# Patient Record
Sex: Male | Born: 1990 | Race: Black or African American | Hispanic: No | Marital: Single | Smoking: Current every day smoker
Health system: Southern US, Community
[De-identification: ages and names within clinical notes are randomized; demographics above are authoritative.]

---
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
## 2014-05-29 ENCOUNTER — Emergency Department (HOSPITAL_COMMUNITY)
Admission: EM | Admit: 2014-05-29 | Discharge: 2014-05-29 | Disposition: A | Discharge status: Home / Self Care | Payer: Self-pay | Attending: Emergency Medicine | Admitting: Emergency Medicine

## 2014-05-29 ENCOUNTER — Encounter (HOSPITAL_COMMUNITY): Payer: Self-pay | Admitting: Emergency Medicine

## 2014-05-29 ENCOUNTER — Emergency Department (HOSPITAL_COMMUNITY): Payer: Self-pay

## 2014-05-29 DIAGNOSIS — T40901A Poisoning by unspecified psychodysleptics [hallucinogens], accidental (unintentional), initial encounter: Secondary | ICD-10-CM | POA: Insufficient documentation

## 2014-05-29 DIAGNOSIS — F411 Generalized anxiety disorder: Secondary | ICD-10-CM | POA: Insufficient documentation

## 2014-05-29 DIAGNOSIS — T50901A Poisoning by unspecified drugs, medicaments and biological substances, accidental (unintentional), initial encounter: Secondary | ICD-10-CM

## 2014-05-29 DIAGNOSIS — T40904A Poisoning by unspecified psychodysleptics [hallucinogens], undetermined, initial encounter: Secondary | ICD-10-CM | POA: Insufficient documentation

## 2014-05-29 DIAGNOSIS — R000 Tachycardia, unspecified: Secondary | ICD-10-CM | POA: Insufficient documentation

## 2014-05-29 DIAGNOSIS — F17200 Nicotine dependence, unspecified, uncomplicated: Secondary | ICD-10-CM | POA: Insufficient documentation

## 2014-05-29 DIAGNOSIS — Y9289 Other specified places as the place of occurrence of the external cause: Secondary | ICD-10-CM | POA: Insufficient documentation

## 2014-05-29 DIAGNOSIS — R002 Palpitations: Secondary | ICD-10-CM | POA: Insufficient documentation

## 2014-05-29 DIAGNOSIS — Y9389 Activity, other specified: Secondary | ICD-10-CM | POA: Insufficient documentation

## 2014-05-29 LAB — RAPID URINE DRUG SCREEN, HOSP PERFORMED
AMPHETAMINES: NOT DETECTED
BENZODIAZEPINES: NOT DETECTED
Barbiturates: NOT DETECTED
Cocaine: NOT DETECTED
Opiates: NOT DETECTED
Tetrahydrocannabinol: POSITIVE — AB

## 2014-05-29 LAB — CBC WITH DIFFERENTIAL/PLATELET
BASOS ABS: 0 10*3/uL (ref 0.0–0.1)
Basophils Relative: 0 % (ref 0–1)
EOS PCT: 2 % (ref 0–5)
Eosinophils Absolute: 0.1 10*3/uL (ref 0.0–0.7)
HCT: 40 % (ref 39.0–52.0)
Hemoglobin: 13.9 g/dL (ref 13.0–17.0)
LYMPHS ABS: 1.1 10*3/uL (ref 0.7–4.0)
Lymphocytes Relative: 22 % (ref 12–46)
MCH: 26.9 pg (ref 26.0–34.0)
MCHC: 34.8 g/dL (ref 30.0–36.0)
MCV: 77.4 fL — AB (ref 78.0–100.0)
MONO ABS: 0.7 10*3/uL (ref 0.1–1.0)
Monocytes Relative: 13 % — ABNORMAL HIGH (ref 3–12)
Neutro Abs: 3.2 10*3/uL (ref 1.7–7.7)
Neutrophils Relative %: 63 % (ref 43–77)
Platelets: 198 10*3/uL (ref 150–400)
RBC: 5.17 MIL/uL (ref 4.22–5.81)
RDW: 12.3 % (ref 11.5–15.5)
WBC: 5.1 10*3/uL (ref 4.0–10.5)

## 2014-05-29 LAB — I-STAT TROPONIN, ED: Troponin i, poc: 0 ng/mL (ref 0.00–0.08)

## 2014-05-29 LAB — TSH: TSH: 0.298 u[IU]/mL — ABNORMAL LOW (ref 0.350–4.500)

## 2014-05-29 LAB — BASIC METABOLIC PANEL
Anion gap: 19 — ABNORMAL HIGH (ref 5–15)
BUN: 11 mg/dL (ref 6–23)
CALCIUM: 9.5 mg/dL (ref 8.4–10.5)
CO2: 20 meq/L (ref 19–32)
CREATININE: 0.82 mg/dL (ref 0.50–1.35)
Chloride: 102 mEq/L (ref 96–112)
GFR calc Af Amer: >90 mL/min (ref >90)
GFR calc non Af Amer: >90 mL/min (ref >90)
GLUCOSE: 150 mg/dL — AB (ref 70–99)
Potassium: 3.5 mEq/L — ABNORMAL LOW (ref 3.7–5.3)
Sodium: 141 mEq/L (ref 137–147)

## 2014-05-29 LAB — ETHANOL

## 2014-05-29 MED ORDER — SODIUM CHLORIDE 0.9 % IV BOLUS (SEPSIS)
1000.0000 mL | Freq: Once | INTRAVENOUS | Status: AC
  Administered 2014-05-29: 1000 mL via INTRAVENOUS

## 2014-05-29 MED ORDER — LORAZEPAM 2 MG/ML IJ SOLN
1.0000 mg | Freq: Once | INTRAMUSCULAR | Status: AC
  Administered 2014-05-29: 1 mg via INTRAVENOUS
  Filled 2014-05-29: qty 1

## 2014-05-29 NOTE — ED Notes (Signed)
Pt to department via EMS-pt reports that he smoked weed about noon today. States that since then he has been feeling SOB, reports that he feels like his heart is racing. Bp-183/74 Hr-150

## 2014-05-29 NOTE — ED Provider Notes (Signed)
CSN: [id number]     Arrival date & time 05/29/14  1831 History   First MD Initiated Contact with Patient 05/29/14 1840     Chief Complaint  Patient presents with  . Tachycardia     (Consider location/radiation/quality/duration/timing/severity/associated sxs/prior Treatment) Patient is a 23 y.o. male presenting with Ingested Medication. The history is provided by the patient.  Ingestion This is a new problem. The current episode started today. The problem occurs constantly. The problem has been unchanged. Pertinent negatives include no abdominal pain, arthralgias, chest pain, chills, congestion, coughing, fever, headaches, myalgias, nausea, rash or vomiting. Associated symptoms comments: Anxiety and tachycardia. Nothing aggravates the symptoms. He has tried nothing for the symptoms. The treatment provided no relief.   23 yo M with a chief complaint tachycardia and anxiety. Earlier today the patient was smoking marijuana with his girlfriend. He said it tasted a little scale to him. After which patient started to have some anxiety feel like he is having palpitations. Patient denies any chest pain. Has some mild shortness breath with this. Patient denies any abdominal pain presyncopal feeling. Patient denies doing any other illegal drugs today.  History reviewed. No pertinent past medical history. History reviewed. No pertinent past surgical history. History reviewed. No pertinent family history. History  Substance Use Topics  . Smoking status: Current Some Day Smoker  . Smokeless tobacco: Not on file  . Alcohol Use: Yes    Review of Systems  Constitutional: Negative for fever and chills.  HENT: Negative for congestion and facial swelling.   Eyes: Negative for discharge and visual disturbance.  Respiratory: Negative for cough and shortness of breath.   Cardiovascular: Positive for palpitations. Negative for chest pain.  Gastrointestinal: Negative for nausea, vomiting, abdominal pain and  diarrhea.  Musculoskeletal: Negative for arthralgias and myalgias.  Skin: Negative for color change and rash.  Neurological: Negative for tremors, syncope and headaches.  Psychiatric/Behavioral: Negative for confusion and dysphoric mood. The patient is nervous/anxious.       Allergies  Review of patient's allergies indicates no known allergies.  Home Medications   Prior to Admission medications   Not on File   BP 114/55  Pulse 69  Temp(Src) 97.4 F (36.3 C) (Oral)  Resp 18  Ht 5\' 6"  (1.676 m)  Wt 178 lb (80.74 kg)  BMI 28.74 kg/m2  SpO2 98% Physical Exam  Constitutional: He is oriented to person, place, and time. He appears well-developed and well-nourished.  HENT:  Head: Normocephalic and atraumatic.  Eyes: EOM are normal. Pupils are equal, round, and reactive to light.  Neck: Normal range of motion. Neck supple. No JVD present.  Cardiovascular: Regular rhythm.  Exam reveals no gallop and no friction rub.   No murmur heard. Tachycardia   Pulmonary/Chest: No respiratory distress. He has no wheezes.  Abdominal: He exhibits no distension. There is no rebound and no guarding.  Musculoskeletal: Normal range of motion.  Neurological: He is alert and oriented to person, place, and time.  Skin: No rash noted. No pallor.  Psychiatric: He has a normal mood and affect. His behavior is normal.    ED Course  Procedures (including critical care time) Labs Review Labs Reviewed  CBC WITH DIFFERENTIAL - Abnormal; Notable for the following:    MCV 77.4 (*)    Monocytes Relative 13 (*)    All other components within normal limits  BASIC METABOLIC PANEL - Abnormal; Notable for the following:    Potassium 3.5 (*)    Glucose, Bld 150 (*)  Anion gap 19 (*)    All other components within normal limits  TSH - Abnormal; Notable for the following:    TSH 0.298 (*)    All other components within normal limits  URINE RAPID DRUG SCREEN (HOSP PERFORMED) - Abnormal; Notable for the  following:    Tetrahydrocannabinol POSITIVE (*)    All other components within normal limits  ETHANOL  I-STAT TROPOININ, ED    Imaging Review Dg Chest Port 1 View  05/29/2014   CLINICAL DATA:  Tachycardia and shortness of Breath  EXAM: PORTABLE CHEST - 1 VIEW  COMPARISON:  None.  FINDINGS: The heart size and mediastinal contours are within normal limits. Both lungs are clear. The visualized skeletal structures are unremarkable.  IMPRESSION: No active disease.   Electronically Signed   By: Signa Kell M.D.   On: 05/29/2014 19:50     EKG Interpretation   Date/Time:  Saturday May 29 2014 18:37:03 EDT Ventricular Rate:  137 PR Interval:  140 QRS Duration: 99 QT Interval:  414 QTC Calculation: 625 R Axis:   94 Text Interpretation:  Sinus tachycardia Borderline right axis deviation  Left ventricular hypertrophy Abnormal T, consider ischemia, diffuse leads  Prolonged QT interval No previous ECGs available Confirmed by YAO  MD,  DAVID ([id number]) on 05/29/2014 7:03:19 PM      MDM   Final diagnoses:  Drug ingestion, accidental, initial encounter    23 yo M with a chief complaint of anxiety tachycardia. Likely due to illegal drug ingestion. We'll give the patient fluids Ativan.  His heart rate improved with Ativan and 3 L of fluid. Patient states he feels much better we'll discharge patient home he'll follow with his PCP. Patient with a mildly low TSH level was discussed with the patient he will follow up with his doctor.   I have discussed the diagnosis/risks/treatment options with the patient and caregiver and believe the pt to be eligible for discharge home to follow-up with PCP. We also discussed returning to the ED immediately if new or worsening sx occur. We discussed the sx which are most concerning (e.g., repeat events) that necessitate immediate return. Medications administered to the patient during their visit and any new prescriptions provided to the patient are listed  below.  Medications given during this visit Medications  sodium chloride 0.9 % bolus 1,000 mL (0 mLs Intravenous Stopped 05/29/14 1953)  LORazepam (ATIVAN) injection 1 mg (1 mg Intravenous Given 05/29/14 1904)  sodium chloride 0.9 % bolus 1,000 mL (0 mLs Intravenous Stopped 05/29/14 2208)    There are no discharge medications for this patient.   Melene Plan, MD 05/30/14 [phone]

## 2014-06-03 NOTE — ED Provider Notes (Signed)
I saw and evaluated the patient, reviewed the resident's note and I agree with the findings and plan.   EKG Interpretation   Date/Time:  Saturday May 29 2014 18:37:03 EDT Ventricular Rate:  137 PR Interval:  140 QRS Duration: 99 QT Interval:  414 QTC Calculation: 625 R Axis:   94 Text Interpretation:  Sinus tachycardia Borderline right axis deviation  Left ventricular hypertrophy Abnormal T, consider ischemia, diffuse leads  Prolonged QT interval No previous ECGs available Confirmed by YAO  MD,  DAVID ([id number]) on 05/29/2014 7:03:19 PM      Marcus Odom is a 23 y.o. male here with anxiety, tachycardia. Was smoking marijuana with girlfriend and then had palpitations and anxiety. Denies cocaine use. Was tachycardic to 130s on arrival. BP stable. Given IVF and ativan and tachycardia improved. QTc initially prolonged. Electrolytes unremarkable. UDS + marijuana but no cocaine. TSH slightly low and will have him f/u outpatient. Tachycardia improved with above treatment. On the monitor QTc doesn't appear prolonged at time of discharge and I think its likely rate related or from marijuana use.   Richardean Canal, MD 06/03/14 [phone]

## 2014-07-11 ENCOUNTER — Encounter (HOSPITAL_COMMUNITY): Payer: Self-pay | Admitting: Emergency Medicine

## 2014-07-11 ENCOUNTER — Emergency Department (INDEPENDENT_AMBULATORY_CARE_PROVIDER_SITE_OTHER)
Admission: EM | Admit: 2014-07-11 | Discharge: 2014-07-11 | Disposition: A | Discharge status: Home / Self Care | Payer: Self-pay | Source: Home / Self Care

## 2014-07-11 DIAGNOSIS — B354 Tinea corporis: Secondary | ICD-10-CM

## 2014-07-11 MED ORDER — CLOTRIMAZOLE-BETAMETHASONE 1-0.05 % EX CREA: TOPICAL_CREAM | CUTANEOUS | Status: DC

## 2014-07-11 NOTE — Discharge Instructions (Signed)

## 2014-07-11 NOTE — ED Notes (Signed)
Rash like ringworm onset 2 weeks ago on L forearm, then spread L ankle and L elbow.  4 days ago got one in L axilla and 2 days ago one under his nose.  Occasional itching around the edge.

## 2014-07-11 NOTE — ED Provider Notes (Signed)
CSN: [id number]     Arrival date & time 07/11/14  1604 History   First MD Initiated Contact with Patient 07/11/14 1653     Chief Complaint  Patient presents with  . Rash   (Consider location/radiation/quality/duration/timing/severity/associated sxs/prior Treatment) HPI Comments: Rash to various parts of the body for 2 weeks. Some itching. Denies systemic sx's.   History reviewed. No pertinent past medical history. History reviewed. No pertinent past surgical history. Family History  Problem Relation Age of Onset  . Thyroid disease Mother   . Diabetes Mother   . Hypertension Father    History  Substance Use Topics  . Smoking status: Current Some Day Smoker -- 0.50 packs/day    Types: Cigarettes  . Smokeless tobacco: Not on file  . Alcohol Use: 1.8 oz/week    3 Cans of beer per week    Review of Systems  Constitutional: Negative.   Skin: Positive for rash.  All other systems reviewed and are negative.   Allergies  Review of patient's allergies indicates no known allergies.  Home Medications   Prior to Admission medications   Medication Sig Start Date End Date Taking? Authorizing Provider  clotrimazole-betamethasone (LOTRISONE) cream Apply to affected area 2 times daily prn 07/11/14   Hayden Rasmussenavid Quinesha Selinger, NP   BP 121/65  Pulse 64  Temp(Src) 99.1 F (37.3 C) (Oral)  Resp 16  SpO2 99% Physical Exam  Nursing note and vitals reviewed. Constitutional: He is oriented to person, place, and time. He appears well-developed and well-nourished. No distress.  Neck: Normal range of motion. Neck supple.  Cardiovascular: Normal rate.   Pulmonary/Chest: No respiratory distress.  Musculoskeletal: He exhibits no edema and no tenderness.  Neurological: He is alert and oriented to person, place, and time.  Skin: Skin is warm and dry.  Annular lesion to the L distal forearm approx 3 cm diam. Silvery colored centra and darkened peripheral scales. Similar but smaller areas to the L ankle, elbow  and above upper lip.   Psychiatric: He has a normal mood and affect.    ED Course  Procedures (including critical care time) Labs Review Labs Reviewed - No data to display  Imaging Review No results found.   MDM   1. Tinea corporis    Most consistent with tinea corporis . He has a hx of Tinea versicolor as well Lotrisone bid. Keep scratched open areas covered.    Hayden Rasmussenavid Annlee Glandon, NP 07/11/14 [phone]

## 2014-07-13 NOTE — ED Provider Notes (Signed)
Medical screening examination/treatment/procedure(s) were performed by resident physician or non-physician practitioner and as supervising physician I was immediately available for consultation/collaboration.   Justin Meisenheimer DOUGLAS MD.   Aiden Rao D Krithika Tome, MD 07/13/14 1127 

## 2016-03-29 ENCOUNTER — Emergency Department (HOSPITAL_COMMUNITY)
Admission: EM | Admit: 2016-03-29 | Discharge: 2016-03-29 | Disposition: A | Discharge status: Home / Self Care | Payer: Self-pay | Attending: Emergency Medicine | Admitting: Emergency Medicine

## 2016-03-29 ENCOUNTER — Encounter (HOSPITAL_COMMUNITY): Payer: Self-pay

## 2016-03-29 DIAGNOSIS — Z202 Contact with and (suspected) exposure to infections with a predominantly sexual mode of transmission: Secondary | ICD-10-CM | POA: Insufficient documentation

## 2016-03-29 DIAGNOSIS — F17210 Nicotine dependence, cigarettes, uncomplicated: Secondary | ICD-10-CM | POA: Insufficient documentation

## 2016-03-29 LAB — URINALYSIS, ROUTINE W REFLEX MICROSCOPIC
BILIRUBIN URINE: NEGATIVE
Glucose, UA: NEGATIVE mg/dL
Hgb urine dipstick: NEGATIVE
KETONES UR: NEGATIVE mg/dL
Leukocytes, UA: NEGATIVE
Nitrite: NEGATIVE
Protein, ur: NEGATIVE mg/dL
SPECIFIC GRAVITY, URINE: 1.025 (ref 1.005–1.030)
pH: 6.5 (ref 5.0–8.0)

## 2016-03-29 MED ORDER — METRONIDAZOLE 500 MG PO TABS
2000.0000 mg | ORAL_TABLET | Freq: Once | ORAL | Status: AC
  Administered 2016-03-29: 2000 mg via ORAL
  Filled 2016-03-29: qty 4

## 2016-03-29 NOTE — ED Provider Notes (Signed)
CSN: [id number]     Arrival date & time 03/29/16  0345 History   First MD Initiated Contact with Patient 03/29/16 [phone]     Chief Complaint  Patient presents with  . SEXUALLY TRANSMITTED DISEASE    HPI Comments: 25 year old male who presents with STD exposure. He states his girlfriend has tested positive for trichomonas. Denies fever, chills, abdominal pain, N/V, penile discharge, rashes/lesions, testicular pain. No known hx of STD.   History reviewed. No pertinent past medical history. History reviewed. No pertinent past surgical history. Family History  Problem Relation Age of Onset  . Thyroid disease Mother   . Diabetes Mother   . Hypertension Father    Social History  Substance Use Topics  . Smoking status: Current Some Day Smoker -- 0.50 packs/day    Types: Cigarettes  . Smokeless tobacco: None  . Alcohol Use: 1.8 oz/week    3 Cans of beer per week    Review of Systems  Gastrointestinal: Negative for abdominal pain.  Genitourinary: Negative for dysuria, flank pain, discharge, penile pain and testicular pain.  Skin: Negative for rash.      Allergies  Review of patient's allergies indicates no known allergies.  Home Medications   Prior to Admission medications   Medication Sig Start Date End Date Taking? Authorizing Provider  clotrimazole-betamethasone (LOTRISONE) cream Apply to affected area 2 times daily prn 07/11/14   Hayden Rasmussenavid Mabe, NP   BP 133/71 mmHg  Pulse 61  Temp(Src) 98.1 F (36.7 C) (Oral)  Resp 20  SpO2 99%   Physical Exam  Constitutional: He is oriented to person, place, and time. He appears well-developed and well-nourished. No distress.  HENT:  Head: Normocephalic and atraumatic.  Eyes: Conjunctivae are normal. Pupils are equal, round, and reactive to light. Right eye exhibits no discharge. Left eye exhibits no discharge. No scleral icterus.  Neck: Normal range of motion.  Cardiovascular: Normal rate.   Pulmonary/Chest: Effort normal. No  respiratory distress.  Abdominal: Soft. He exhibits no distension. There is no tenderness.  Neurological: He is alert and oriented to person, place, and time.  Skin: Skin is warm and dry.  Psychiatric: He has a normal mood and affect.    ED Course  Procedures (including critical care time) Labs Review Labs Reviewed  URINALYSIS, ROUTINE W REFLEX MICROSCOPIC [facility])  GC/CHLAMYDIA PROBE AMP ([place]) [facility]    MDM   Final diagnoses:  STD exposure   25 year old male who presents with STD exposure. UA is clean and patient is currently asymptomatic. Offered one time dose of prophylactic treatment of Flagyl 2g PO. Pt advised to not drink alcohol for 24 hours after dose. Patient is NAD, non-toxic, with stable VS. Patient is informed of clinical course, understands medical decision making process, and agrees with plan. Opportunity for questions provided and all questions answered. Return precautions given.     Bethel BornKelly Marie Shanitra Phillippi, PA-C 03/29/16 [phone]  Derwood KaplanAnkit Nanavati, MD 04/03/16 [phone]

## 2016-03-29 NOTE — ED Notes (Signed)
Pt wants tested for STD, his girlfriend was just dx with trich, he has no sx

## 2017-10-03 ENCOUNTER — Telehealth: Payer: Self-pay

## 2017-10-03 NOTE — Telephone Encounter (Signed)
Made encounter in error

## 2018-01-20 ENCOUNTER — Encounter: Payer: Self-pay | Admitting: Emergency Medicine

## 2018-01-20 ENCOUNTER — Other Ambulatory Visit: Payer: Self-pay

## 2018-01-20 ENCOUNTER — Emergency Department
Admission: EM | Admit: 2018-01-20 | Discharge: 2018-01-20 | Disposition: A | Discharge status: Home / Self Care | Payer: Self-pay | Attending: Emergency Medicine | Admitting: Emergency Medicine

## 2018-01-20 ENCOUNTER — Emergency Department: Payer: Self-pay

## 2018-01-20 DIAGNOSIS — R079 Chest pain, unspecified: Secondary | ICD-10-CM | POA: Insufficient documentation

## 2018-01-20 DIAGNOSIS — K5900 Constipation, unspecified: Secondary | ICD-10-CM | POA: Insufficient documentation

## 2018-01-20 DIAGNOSIS — R1084 Generalized abdominal pain: Secondary | ICD-10-CM | POA: Insufficient documentation

## 2018-01-20 DIAGNOSIS — F17210 Nicotine dependence, cigarettes, uncomplicated: Secondary | ICD-10-CM | POA: Insufficient documentation

## 2018-01-20 LAB — COMPREHENSIVE METABOLIC PANEL
ALT: 28 U/L (ref 17–63)
ANION GAP: 5 (ref 5–15)
AST: 26 U/L (ref 15–41)
Albumin: 4.3 g/dL (ref 3.5–5.0)
Alkaline Phosphatase: 47 U/L (ref 38–126)
BILIRUBIN TOTAL: 0.5 mg/dL (ref 0.3–1.2)
BUN: 8 mg/dL (ref 6–20)
CALCIUM: 9 mg/dL (ref 8.9–10.3)
CO2: 28 mmol/L (ref 22–32)
CREATININE: 0.59 mg/dL — AB (ref 0.61–1.24)
Chloride: 109 mmol/L (ref 101–111)
GFR calc Af Amer: >60 mL/min (ref >60)
GFR calc non Af Amer: >60 mL/min (ref >60)
Glucose, Bld: 107 mg/dL — ABNORMAL HIGH (ref 65–99)
Potassium: 4.1 mmol/L (ref 3.5–5.1)
Sodium: 142 mmol/L (ref 135–145)
Total Protein: 7.5 g/dL (ref 6.5–8.1)

## 2018-01-20 LAB — CBC
HCT: 41.9 % (ref 40.0–52.0)
HEMOGLOBIN: 14.3 g/dL (ref 13.0–18.0)
MCH: 27.9 pg (ref 26.0–34.0)
MCHC: 34.2 g/dL (ref 32.0–36.0)
MCV: 81.4 fL (ref 80.0–100.0)
PLATELETS: 231 10*3/uL (ref 150–440)
RBC: 5.14 MIL/uL (ref 4.40–5.90)
RDW: 13.7 % (ref 11.5–14.5)
WBC: 5 10*3/uL (ref 3.8–10.6)

## 2018-01-20 LAB — TROPONIN I: Troponin I: <0.03 ng/mL (ref <0.03)

## 2018-01-20 LAB — LIPASE, BLOOD: Lipase: 27 U/L (ref 11–51)

## 2018-01-20 MED ORDER — FAMOTIDINE 40 MG PO TABS: 40.0000 mg | ORAL_TABLET | Freq: Every evening | ORAL | 0 refills | Status: AC

## 2018-01-20 MED ORDER — DOCUSATE SODIUM 100 MG PO CAPS: 100.0000 mg | ORAL_CAPSULE | Freq: Every day | ORAL | 0 refills | Status: AC | PRN

## 2018-01-20 NOTE — ED Notes (Addendum)
[facility] care assumed.  Pt resting in bed with significant other.  Pt appears in nad at this time.

## 2018-01-20 NOTE — ED Notes (Signed)
Pt to xray

## 2018-01-20 NOTE — ED Provider Notes (Signed)
[facility] Emergency Department Provider Note  ____________________________________________   First MD Initiated Contact with Patient 01/20/18 [phone]     (approximate)  I have reviewed the triage vital signs and the nursing notes.   HISTORY  Chief Complaint Abdominal Pain; Chest Pain; and Constipation   HPI Marcus Odom is a 27 y.o. male with a history of constipation was presenting to the emergency department today with "tightness" to his rectum as well as lower abdomen as well as intermittent chest pain.  He says that the constipation has been ongoing over the past 2 years and will alternate occasionally with diarrhea.  He says that he eats fruits and vegetables and is taken magnesium citrate in the past in order to relieve the symptoms.  He says the magnesium citrate will work acutely but does not solve the problem in the long-term.  He states that he also has burning chest pain occasionally, especially in the mornings.  Also over the past week he has had nasal congestion as well as what he describes as a "postnasal drip."  He says that he coughs up mucus in the morning and sometimes throughout the day.  Does not report any fever.  Does not report any shortness of breath.  Says that yesterday when he was moving his bowels he had small pellet stools and today had loose stools as of this morning.  Does not of a primary care doctor.   History reviewed. No pertinent past medical history.  There are no active problems to display for this patient.   History reviewed. No pertinent surgical history.  Prior to Admission medications   Not on File    Allergies Patient has no known allergies.  Family History  Problem Relation Age of Onset  . Thyroid disease Mother   . Diabetes Mother   . Hypertension Father     Social History Social History   Tobacco Use  . Smoking status: Current Some Day Smoker    Packs/day: 0.50    Types: Cigarettes  . Smokeless  tobacco: Never Used  Substance Use Topics  . Alcohol use: Yes    Alcohol/week: 1.8 oz    Types: 3 Cans of beer per week  . Drug use: Yes    Frequency: 3.0 times per week    Types: Marijuana    Comment: last smoked last night    Review of Systems  Constitutional: No fever/chills Eyes: No visual changes. ENT: No sore throat. Cardiovascular: As above.  He says that the chest pain is to the lower chest.  Denies any chest pain at this time.  Denies any radiation of the pain. Respiratory: Denies shortness of breath. Gastrointestinal: No nausea, no vomiting.  Genitourinary: Negative for dysuria. Musculoskeletal: Negative for back pain. Skin: Negative for rash. Neurological: Negative for headaches, focal weakness or numbness.   ____________________________________________   PHYSICAL EXAM:  VITAL SIGNS: ED Triage Vitals  Enc Vitals Group     BP 01/20/18 0618 134/83     Pulse Rate 01/20/18 0618 (!) 56     Resp 01/20/18 0618 16     Temp 01/20/18 0618 98.6 F (37 C)     Temp Source 01/20/18 0618 Oral     SpO2 01/20/18 0618 100 %     Weight 01/20/18 0622 175 lb (79.4 kg)     Height 01/20/18 0622 5\' 6"  (1.676 m)     Head Circumference --      Peak Flow --  Pain Score 01/20/18 0622 8     Pain Loc --      Pain Edu? --      Excl. in GC? --     Constitutional: Alert and oriented. Well appearing and in no acute distress. Eyes: Conjunctivae are normal.  Head: Atraumatic. Nose: No congestion/rhinnorhea. Mouth/Throat: Mucous membranes are moist.  Enlarged tonsils, but without redness or exudate.   Neck: No stridor.   Cardiovascular: Normal rate, regular rhythm. Grossly normal heart sounds.   Respiratory: Normal respiratory effort.  No retractions. Lungs CTAB. Gastrointestinal: Soft and nontender. No distention. No CVA tenderness. Musculoskeletal: No lower extremity tenderness nor edema.  No joint effusions. Neurologic:  Normal speech and language. No gross focal neurologic  deficits are appreciated. Skin:  Skin is warm, dry and intact. No rash noted. Psychiatric: Mood and affect are normal. Speech and behavior are normal.  ____________________________________________   LABS (all labs ordered are listed, but only abnormal results are displayed)  Labs Reviewed  COMPREHENSIVE METABOLIC PANEL - Abnormal; Notable for the following components:      Result Value   Glucose, Bld 107 (*)    Creatinine, Ser 0.59 (*)    All other components within normal limits  TROPONIN I  LIPASE, BLOOD  CBC   ____________________________________________  EKG  ED ECG REPORT I, Arelia Longest, the attending physician, personally viewed and interpreted this ECG.   Date: 01/20/2018  EKG Time: 0622  Rate: 64  Rhythm: normal sinus rhythm  Axis: Normal  Intervals:none  ST&T Change: No abnormal T wave inversion.  Diffuse ST segment elevation which is concave and consistent with benign early repolarization.  ____________________________________________  RADIOLOGY  No acute finding on the chest x-ray ____________________________________________   PROCEDURES  Procedure(s) performed:   Procedures  Critical Care performed:   ____________________________________________   INITIAL IMPRESSION / ASSESSMENT AND PLAN / ED COURSE  Pertinent labs & imaging results that were available during my care of the patient were reviewed by me and considered in my medical decision making (see chart for details).  Differential diagnosis includes, but is not limited to, ACS, aortic dissection, pulmonary embolism, cardiac tamponade, pneumothorax, pneumonia, pericarditis, myocarditis, GI-related causes including esophagitis/gastritis, and musculoskeletal chest wall pain.   Differential diagnosis includes, but is not limited to, acute appendicitis, renal colic, testicular torsion, urinary tract infection/pyelonephritis, prostatitis,  epididymitis, diverticulitis, small bowel obstruction  or ileus, colitis, abdominal aortic aneurysm, gastroenteritis, hernia, etc.  ----------------------------------------- 8:08 AM on 01/20/2018 -----------------------------------------  Patient with very benign appearance.  Very reassuring lab work as well as chest x-ray.  Patient also denied any urinary symptoms.  Patient is concerned for IBS says that his father has IBS.  Symptoms do not fluctuate with stress.  Will start patient on Pepcid for possible reflux and also give him Colace for constipation as needed.  We also discussed following up with primary care.  I will give the patient several resources for primary care.  I do not see what represents a life-threatening condition.  I do not believe he is having a heart attack or pulmonary embolus.  PE RC negative.  ____________________________________________   FINAL CLINICAL IMPRESSION(S) / ED DIAGNOSES  Chest pain.  Abdominal pain.  Constipation.    NEW MEDICATIONS STARTED DURING THIS VISIT:  New Prescriptions   No medications on file     Note:  This document was prepared using Dragon voice recognition software and may include unintentional dictation errors.     Myrna Blazer, MD 01/20/18 [phone]

## 2018-01-20 NOTE — ED Triage Notes (Signed)
Pt reports intermittent epigastric pain x 1 week; feels bloated; pt reports rectal pain/pressure for several days; moved small amount of hard stool this am, last normal bowel movement was 2-3 days ago; denies N/V; pt thinks he may have IBS; pt also pain to center of chest that started this am; tight feeling with some shortness of breath; talking in complete coherent sentences

## 2018-12-09 IMAGING — CR DG CHEST 2V
2 series · 2 of 2 positions shown · non-contrast
Comparison: 05/29/2014

CLINICAL DATA: 26-year-old male with a history of epigastric pain

EXAM:
CHEST - 2 VIEW

[chest pa]
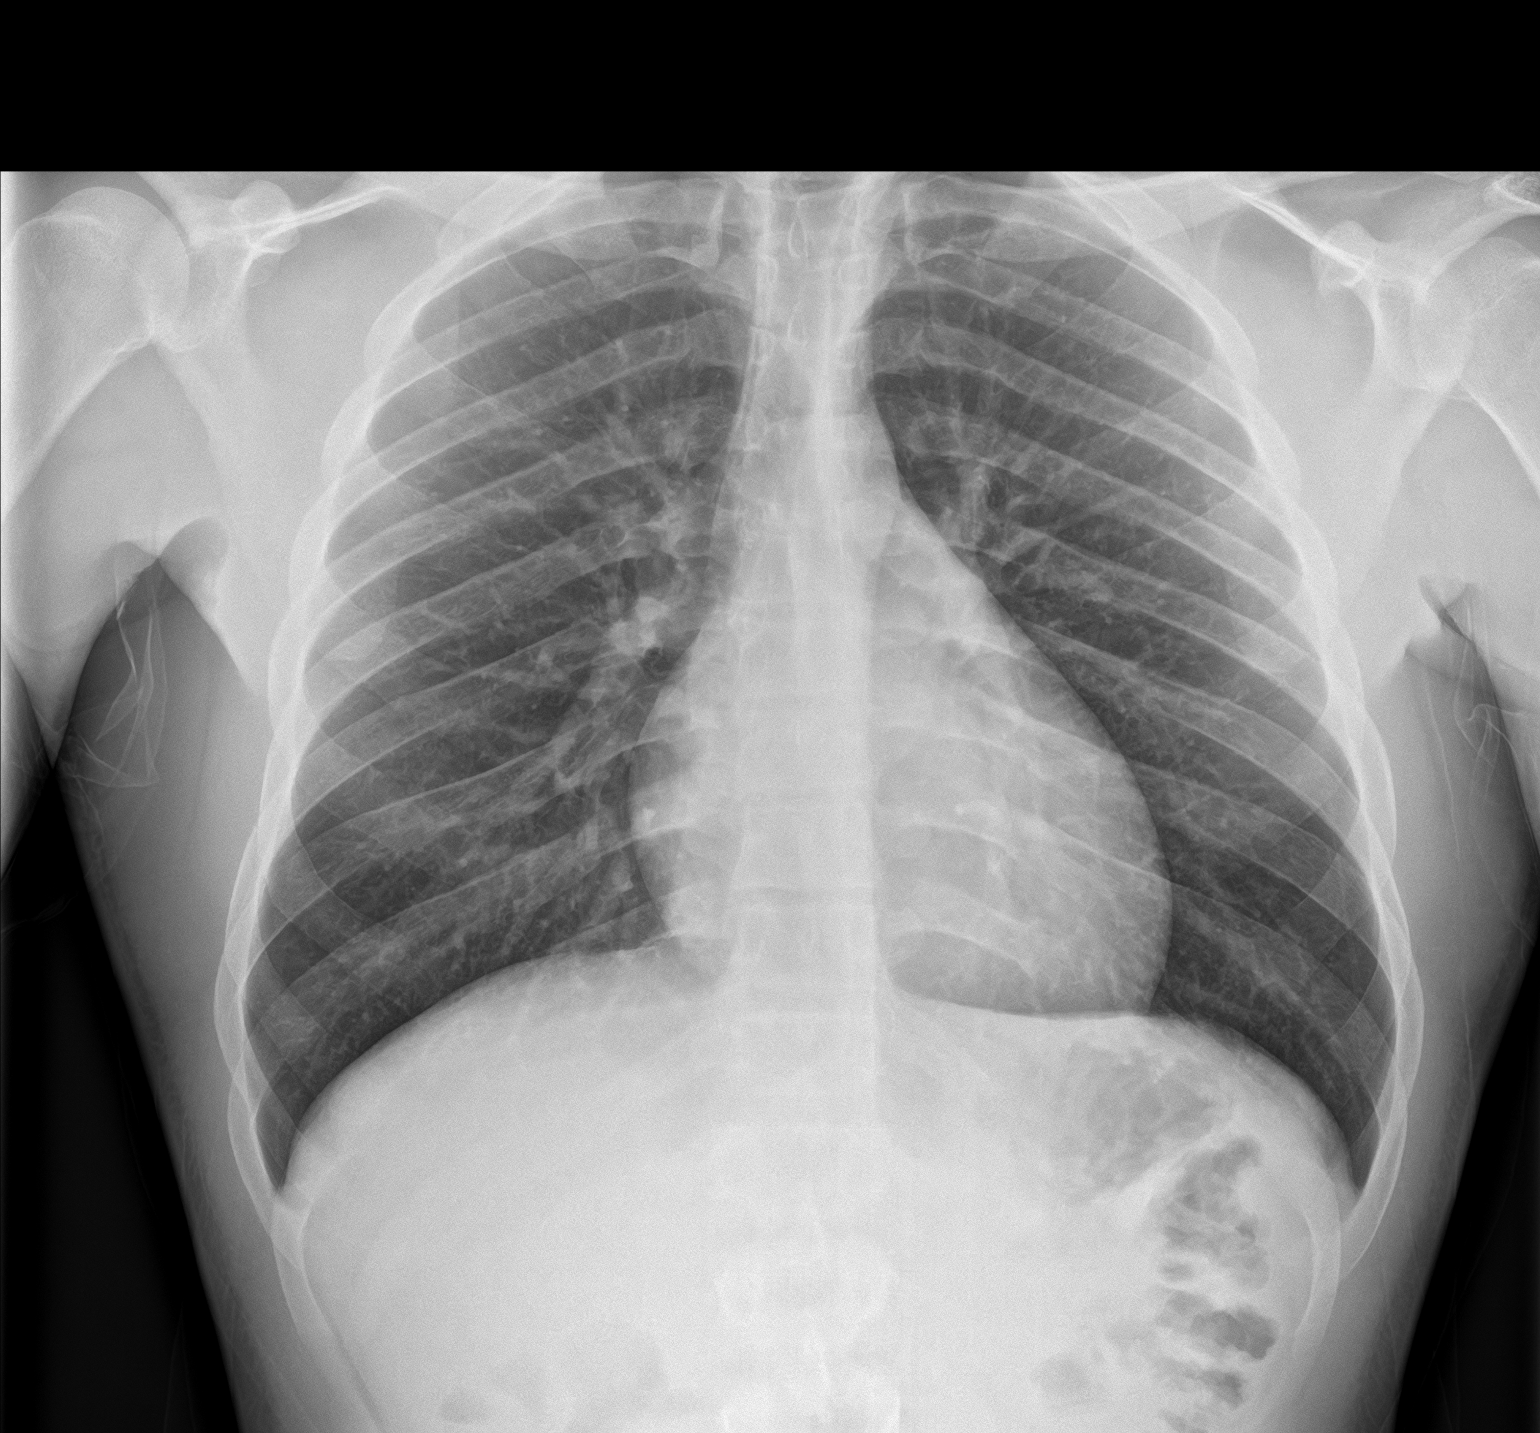

[chest lat]
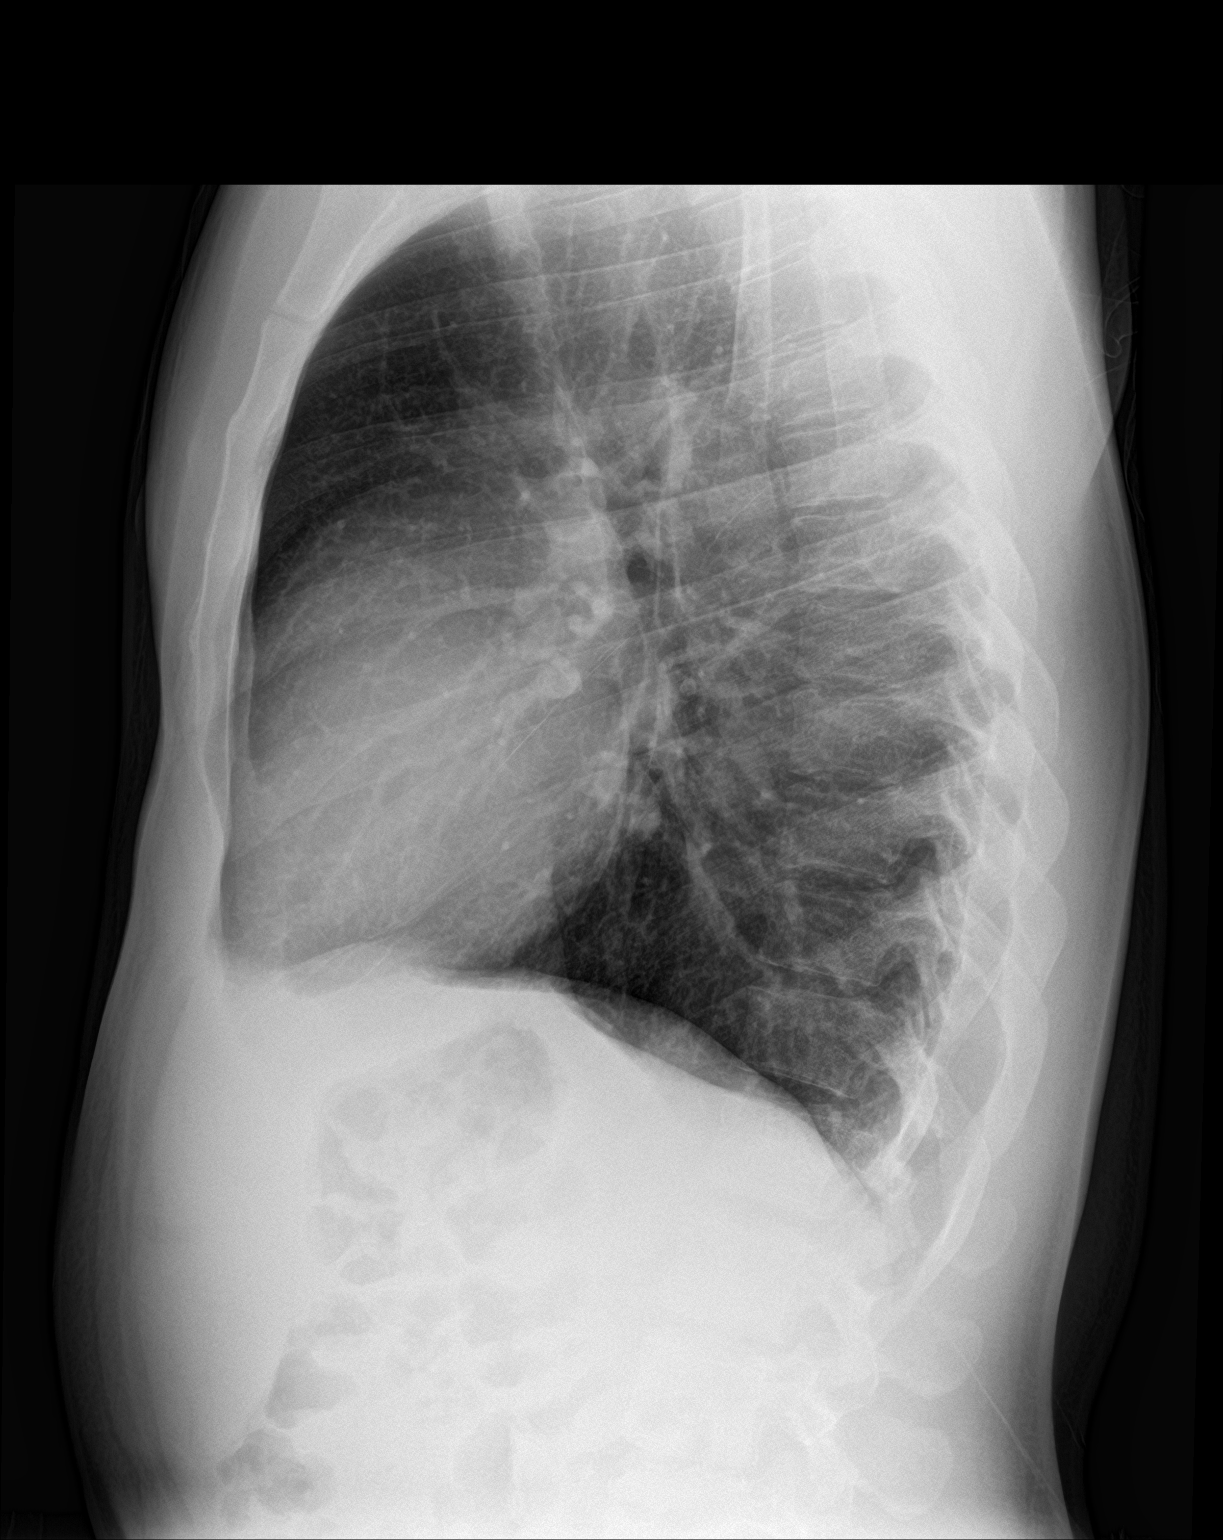

[2 of 2 positions shown; findings below may reference images not displayed]

FINDINGS: The heart size and mediastinal contours are within normal limits.
Both lungs are clear. The visualized skeletal structures are
unremarkable.
IMPRESSION: No radiographic evidence of acute cardiopulmonary disease

## 2019-11-07 ENCOUNTER — Other Ambulatory Visit: Payer: Self-pay

## 2019-11-07 ENCOUNTER — Emergency Department
Admission: EM | Admit: 2019-11-07 | Discharge: 2019-11-07 | Disposition: A | Discharge status: Home / Self Care | Payer: Medicaid Other | Attending: Emergency Medicine | Admitting: Emergency Medicine

## 2019-11-07 DIAGNOSIS — K0889 Other specified disorders of teeth and supporting structures: Secondary | ICD-10-CM

## 2019-11-07 DIAGNOSIS — F17210 Nicotine dependence, cigarettes, uncomplicated: Secondary | ICD-10-CM | POA: Insufficient documentation

## 2019-11-07 DIAGNOSIS — K047 Periapical abscess without sinus: Secondary | ICD-10-CM | POA: Insufficient documentation

## 2019-11-07 MED ORDER — AMOXICILLIN 500 MG PO CAPS
1000.0000 mg | ORAL_CAPSULE | Freq: Once | ORAL | Status: AC
Start: 2019-11-07 — End: 2019-11-07
  Administered 2019-11-07: 1000 mg via ORAL
  Filled 2019-11-07: qty 2

## 2019-11-07 MED ORDER — AMOXICILLIN 875 MG PO TABS: 875.0000 mg | ORAL_TABLET | Freq: Two times a day (BID) | ORAL | 0 refills | Status: AC

## 2019-11-07 MED ORDER — IBUPROFEN 600 MG PO TABS
600.0000 mg | ORAL_TABLET | Freq: Once | ORAL | Status: AC
  Administered 2019-11-07: 600 mg via ORAL
  Filled 2019-11-07: qty 1

## 2019-11-07 NOTE — ED Provider Notes (Signed)
[facility] EMERGENCY DEPARTMENT Provider Note   CSN: [id number] Arrival date & time: 11/07/19  1749     History Chief Complaint  Patient presents with  . Dental Pain    Marcus Odom is a 29 y.o. male presents to the emergency department evaluation of dental pain.  Pain is been present for 2 weeks.  Patient states he cracked his tooth to weeks ago and developed pain and swelling over the last couple days.  He denies any fevers difficulty swallowing.  No significant facial swelling.  Has not noticed any drainage.  Tolerating p.o. well.  Getting relief with Orajel but not taking any other medications.  No rashes.  HPI     No past medical history on file.  There are no problems to display for this patient.   No past surgical history on file.     Family History  Problem Relation Age of Onset  . Thyroid disease Mother   . Diabetes Mother   . Hypertension Father     Social History   Tobacco Use  . Smoking status: Current Some Day Smoker    Packs/day: 0.50    Types: Cigarettes  . Smokeless tobacco: Never Used  Substance Use Topics  . Alcohol use: Yes    Alcohol/week: 3.0 standard drinks    Types: 3 Cans of beer per week  . Drug use: Yes    Frequency: 3.0 times per week    Types: Marijuana    Comment: last smoked last night    Home Medications Prior to Admission medications   Medication Sig Start Date End Date Taking? Authorizing Provider  amoxicillin (AMOXIL) 875 MG tablet Take 1 tablet (875 mg total) by mouth 2 (two) times daily for 7 days. X 10 days 11/07/19 11/14/19  Duanne Guess, PA-C  famotidine (PEPCID) 40 MG tablet Take 1 tablet (40 mg total) by mouth every evening. 01/20/18 01/20/19  Schaevitz, Randall An, MD    Allergies    Patient has no known allergies.  Review of Systems   Review of Systems  Constitutional: Negative for fever.  HENT: Positive for dental problem.   Respiratory: Negative for cough and shortness of  breath.   Skin: Negative for rash.  Neurological: Negative for dizziness, numbness and headaches.    Physical Exam Updated Vital Signs BP (!) 151/74   Pulse (!) 57   Temp 99.1 F (37.3 C) (Oral)   Resp 14   SpO2 100%   Physical Exam Constitutional:      General: He is not in acute distress.    Appearance: He is well-developed.  HENT:     Head: Normocephalic and atraumatic.     Jaw: No trismus.     Right Ear: External ear normal.     Left Ear: External ear normal.     Nose: Nose normal.     Mouth/Throat:     Mouth: No oral lesions.     Dentition: Normal dentition. No dental caries or gum lesions.     Pharynx: Oropharynx is clear. Uvula midline. No posterior oropharyngeal erythema or uvula swelling.     Tonsils: No tonsillar exudate.   Cardiovascular:     Rate and Rhythm: Normal rate.     Heart sounds: No murmur. No friction rub. No gallop.   Pulmonary:     Effort: Pulmonary effort is normal. No respiratory distress.     Breath sounds: Normal breath sounds.  Musculoskeletal:     Cervical back: Normal range  of motion and neck supple.  Skin:    General: Skin is warm and dry.  Neurological:     Mental Status: He is alert and oriented to person, place, and time.  Psychiatric:        Behavior: Behavior normal.        Thought Content: Thought content normal.     ED Results / Procedures / Treatments   Labs (all labs ordered are listed, but only abnormal results are displayed) Labs Reviewed - No data to display  EKG None  Radiology No results found.  Procedures Procedures (including critical care time)  Medications Ordered in ED Medications  amoxicillin (AMOXIL) capsule 1,000 mg (has no administration in time range)  ibuprofen (ADVIL) tablet 600 mg (has no administration in time range)    ED Course  I have reviewed the triage vital signs and the nursing notes.  Pertinent labs & imaging results that were available during my care of the patient were reviewed  by me and considered in my medical decision making (see chart for details).    MDM Rules/Calculators/A&P                     29 year old male with dental pain secondary to cracked tooth.  Likely has underlying mild infection.  Started on antibiotic.  He will take Tylenol and ibuprofen for mild to moderate pain.  He will follow-up with dental clinic.  He understands signs symptoms return to ED for.      Final Clinical Impression(s) / ED Diagnoses Final diagnoses:  Pain, dental  Dental infection    Rx / DC Orders ED Discharge Orders         Ordered    amoxicillin (AMOXIL) 875 MG tablet  2 times daily     11/07/19 1925           Ronnette Juniper 11/07/19 1929    Minna Antis, MD 11/07/19 2049

## 2019-11-07 NOTE — Discharge Instructions (Addendum)
Please alternate Tylenol and ibuprofen as needed for pain.  Continue with Orajel.  Take antibiotic as prescribed.  Follow-up with dental clinic.  Please see dental clinic handout provided by the emergency department.  Return to the ER for any fevers increasing pain difficulty swallowing worsening symptoms or urgent changes in health.

## 2019-11-07 NOTE — ED Notes (Signed)
Pt states he has been using orajel for pain

## 2019-11-07 NOTE — ED Triage Notes (Signed)
Pt presents via POV c/o right upper dental pain x1 month. Denies fever.

## 2022-01-24 ENCOUNTER — Encounter: Payer: Self-pay | Admitting: Family Medicine

## 2022-01-24 ENCOUNTER — Ambulatory Visit: Payer: Self-pay | Admitting: Family Medicine

## 2022-01-24 DIAGNOSIS — Z202 Contact with and (suspected) exposure to infections with a predominantly sexual mode of transmission: Secondary | ICD-10-CM

## 2022-01-24 DIAGNOSIS — Z113 Encounter for screening for infections with a predominantly sexual mode of transmission: Secondary | ICD-10-CM

## 2022-01-24 LAB — HEPATITIS B SURFACE ANTIGEN: Hepatitis B Surface Ag: NONREACTIVE

## 2022-01-24 LAB — GRAM STAIN

## 2022-01-24 LAB — HM HIV SCREENING LAB: HM HIV Screening: NEGATIVE

## 2022-01-24 LAB — HM HEPATITIS C SCREENING LAB: HM Hepatitis Screen: NEGATIVE

## 2022-01-24 MED ORDER — METRONIDAZOLE 500 MG PO TABS: 500.0000 mg | ORAL_TABLET | Freq: Two times a day (BID) | ORAL | 0 refills | Status: AC

## 2022-01-24 MED ORDER — METRONIDAZOLE 500 MG PO TABS: 500.0000 mg | ORAL_TABLET | Freq: Two times a day (BID) | ORAL | 0 refills | Status: DC

## 2022-01-24 NOTE — Progress Notes (Signed)
Pt here for STD screening.  Gram stain results reviewed.  Medication dispensed per Provider orders. Condoms given. Maple Odaniel M Clatie Kessen, RN  

## 2022-01-24 NOTE — Progress Notes (Signed)
[facility] Department ?STI clinic/screening visit ? ?Subjective:  ?Marcus Odom is a 31 y.o. male being seen today for an STI screening visit. The patient reports they do not have symptoms.   ? ?Patient has the following medical conditions:  There are no problems to display for this patient. ? ? ? ?Chief Complaint  ?Patient presents with  ? SEXUALLY TRANSMITTED DISEASE  ?  Screening  ? ? ?HPI ? ?Patient reports here for screening, reports previous partner tested positive for Trich.  ? ?Does the patNoient or their partner desires a pregnancy in the next year?  ? ?Screening for MPX risk: ?Does the patient have an unexplained rash? No ?Is the patient MSM? No ?Does the patient endorse multiple sex partners or anonymous sex partners? No ?Did the patient have close or sexual contact with a person diagnosed with MPX? No ?Has the patient traveled outside the [place] where MPX is endemic? No ?Is there a high clinical suspicion for MPX-- evidenced by one of the following No ? -Unlikely to be chickenpox ? -Lymphadenopathy ? -Rash that present in same phase of evolution on any given body part ? ? ?See flowsheet for further details and programmatic requirements.  ? ? ?The following portions of the patient's history were reviewed and updated as appropriate: allergies, current medications, past medical history, past social history, past surgical history and problem list. ? ?Objective:  ?There were no vitals filed for this visit. ? ?Physical Exam ?Constitutional:   ?   Appearance: Normal appearance.  ?HENT:  ?   Head: Normocephalic.  ?   Mouth/Throat:  ?   Mouth: Mucous membranes are moist.  ?   Pharynx: Oropharynx is clear. No oropharyngeal exudate.  ?Pulmonary:  ?   Effort: Pulmonary effort is normal.  ?Genitourinary: ?   Penis: Normal.   ?   Testes: Normal.  ?   Comments: No lice, nits, or pest, no lesions or odor discharge.  Denies pain or tenderness with paplation of testicles.  No lesions, ulcers or masses present.    ? ?Musculoskeletal:  ?   Cervical back: Normal range of motion.  ?Lymphadenopathy:  ?   Cervical: No cervical adenopathy.  ?Skin: ?   General: Skin is warm and dry.  ?   Findings: No bruising, erythema, lesion or rash.  ?Neurological:  ?   Mental Status: He is alert and oriented to person, place, and time.  ?Psychiatric:     ?   Mood and Affect: Mood normal.     ?   Behavior: Behavior normal.  ? ? ? ? ?Assessment and Plan:  ?Marcus Odom is a 31 y.o. male presenting to the [facility] Department for STI screening ? ?1. Screening examination for venereal disease ?Patient does not have STI symptoms ?Patient accepted all screenings including  gram stain,  oral, urethral GC and bloodwork for HIV/RPR.  ?Patient meets criteria for HepB screening? Yes. Ordered? Yes ?Patient meets criteria for HepC screening? Yes. Ordered? Yes ?Recommended condom use with all sex ?Discussed importance of condom use for STI prevent ? ?Treat gram stain per standing order ?Discussed time line for State [facility] results and that patient will be called with positive results and encouraged patient to call if he had not heard in 2 weeks ?Recommended returning for continued or worsening symptoms.  ?- Gonococcus culture ?- Gram stain ?- HBV Antigen/Antibody [facility] ?- HIV/HCV [place] [facility] ?- Syphilis Serology, [place] [facility] ?- Gonococcus culture ? ?2. Exposure  to trichomonas ?Pt treated as contact.   ?Questions answered.  ?- metroNIDAZOLE (FLAGYL) 500 MG tablet; Take 1 tablet (500 mg total) by mouth 2 (two) times daily for 7 days.  Dispense: 14 tablet; Refill: 0 ? ? ? ? ?Return for as needed. ? ?No future appointments. ? ?Wendi Snipes, FNP ? ?

## 2022-01-29 LAB — GONOCOCCUS CULTURE

## 2022-12-28 ENCOUNTER — Ambulatory Visit: Payer: BC Managed Care – PPO
